# Patient Record
Sex: Male | Born: 2005 | Race: Black or African American | Hispanic: No | Marital: Single
Health system: Southern US, Community
[De-identification: ages and names within clinical notes are randomized; demographics above are authoritative.]

## PROBLEM LIST (undated history)

## (undated) HISTORY — PX: MOUTH SURGERY: SHX715

---
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
## 2023-04-24 DIAGNOSIS — M25521 Pain in right elbow: Secondary | ICD-10-CM | POA: Diagnosis not present

## 2023-04-24 DIAGNOSIS — D581 Hereditary elliptocytosis: Secondary | ICD-10-CM | POA: Diagnosis not present

## 2023-04-24 DIAGNOSIS — H5789 Other specified disorders of eye and adnexa: Secondary | ICD-10-CM | POA: Diagnosis not present

## 2024-01-07 DIAGNOSIS — J019 Acute sinusitis, unspecified: Secondary | ICD-10-CM | POA: Diagnosis not present

## 2024-01-07 DIAGNOSIS — J029 Acute pharyngitis, unspecified: Secondary | ICD-10-CM | POA: Diagnosis not present

## 2024-01-07 DIAGNOSIS — B349 Viral infection, unspecified: Secondary | ICD-10-CM | POA: Diagnosis not present

## 2024-01-07 DIAGNOSIS — J069 Acute upper respiratory infection, unspecified: Secondary | ICD-10-CM | POA: Diagnosis not present
